# Patient Record
Sex: Female | Born: 1975 | Race: White | Hispanic: No | Marital: Single | Smoking: Never smoker
Health system: Southern US, Community
[De-identification: ages and names within clinical notes are randomized; demographics above are authoritative.]

## PROBLEM LIST (undated history)

## (undated) DIAGNOSIS — Z87440 Personal history of urinary (tract) infections: Secondary | ICD-10-CM

## (undated) HISTORY — DX: Personal history of urinary (tract) infections: Z87.440

## (undated) HISTORY — PX: TONSILLECTOMY: SUR1361

---

## 2010-04-27 LAB — ABO/RH: RH Type: POSITIVE

## 2010-04-27 LAB — GC/CHLAMYDIA PROBE AMP, GENITAL
Chlamydia: NEGATIVE
Gonorrhea: NEGATIVE

## 2010-04-27 LAB — HEPATITIS B SURFACE ANTIGEN: Hepatitis B Surface Ag: NEGATIVE

## 2010-04-27 LAB — RUBELLA ANTIBODY, IGM: Rubella: IMMUNE

## 2010-10-16 LAB — STREP B DNA PROBE: GBS: POSITIVE

## 2010-11-30 ENCOUNTER — Telehealth (HOSPITAL_COMMUNITY): Payer: Self-pay | Admitting: *Deleted

## 2010-11-30 ENCOUNTER — Encounter (HOSPITAL_COMMUNITY): Payer: Self-pay | Admitting: *Deleted

## 2010-11-30 NOTE — Telephone Encounter (Signed)
Preadmission screen  

## 2010-12-01 ENCOUNTER — Inpatient Hospital Stay (HOSPITAL_COMMUNITY)
Admission: AD | Admit: 2010-12-01 | Discharge: 2010-12-06 | DRG: 766 | Disposition: A | Discharge status: Home / Self Care | Payer: Medicaid Other | Source: Ambulatory Visit | Attending: Obstetrics and Gynecology | Admitting: Obstetrics and Gynecology

## 2010-12-01 ENCOUNTER — Encounter (HOSPITAL_COMMUNITY): Payer: Self-pay | Admitting: *Deleted

## 2010-12-01 DIAGNOSIS — O339 Maternal care for disproportion, unspecified: Secondary | ICD-10-CM | POA: Diagnosis present

## 2010-12-01 DIAGNOSIS — O99892 Other specified diseases and conditions complicating childbirth: Secondary | ICD-10-CM | POA: Diagnosis present

## 2010-12-01 DIAGNOSIS — O330 Maternal care for disproportion due to deformity of maternal pelvic bones: Secondary | ICD-10-CM | POA: Diagnosis present

## 2010-12-01 DIAGNOSIS — O324XX0 Maternal care for high head at term, not applicable or unspecified: Secondary | ICD-10-CM | POA: Diagnosis present

## 2010-12-01 DIAGNOSIS — O480 Post-term pregnancy: Principal | ICD-10-CM | POA: Diagnosis present

## 2010-12-01 DIAGNOSIS — Z22330 Carrier of Group B streptococcus: Secondary | ICD-10-CM

## 2010-12-01 LAB — RPR: RPR Ser Ql: NONREACTIVE

## 2010-12-01 LAB — CBC
MCHC: 34 g/dL (ref 30.0–36.0)
Platelets: 212 10*3/uL (ref 150–400)
RDW: 14.5 % (ref 11.5–15.5)

## 2010-12-01 MED ORDER — ZOLPIDEM TARTRATE 10 MG PO TABS
10.0000 mg | ORAL_TABLET | Freq: Every evening | ORAL | Status: DC | PRN
  Administered 2010-12-02: 10 mg via ORAL
  Filled 2010-12-01: qty 2

## 2010-12-01 MED ORDER — LACTATED RINGERS IV SOLN
INTRAVENOUS | Status: DC
  Administered 2010-12-02: 10:00:00 via INTRAVENOUS

## 2010-12-01 MED ORDER — OXYTOCIN 20 UNITS IN LACTATED RINGERS INFUSION - SIMPLE
1.0000 m[IU]/min | INTRAVENOUS | Status: DC
  Administered 2010-12-01: 2 m[IU]/min via INTRAVENOUS
  Filled 2010-12-01: qty 1000

## 2010-12-01 MED ORDER — OXYTOCIN BOLUS FROM INFUSION
500.0000 mL | Freq: Once | INTRAVENOUS | Status: DC
  Filled 2010-12-01: qty 500

## 2010-12-01 MED ORDER — IBUPROFEN 600 MG PO TABS: 600.0000 mg | ORAL_TABLET | Freq: Four times a day (QID) | ORAL | Status: DC | PRN

## 2010-12-01 MED ORDER — LACTATED RINGERS IV SOLN
INTRAVENOUS | Status: DC
  Administered 2010-12-02: 17:00:00 via INTRAVENOUS
  Administered 2010-12-02: 125 mL/h via INTRAVENOUS
  Administered 2010-12-03 (×2): via INTRAVENOUS

## 2010-12-01 MED ORDER — OXYTOCIN 20 UNITS IN LACTATED RINGERS INFUSION - SIMPLE
1.0000 m[IU]/min | INTRAVENOUS | Status: DC
  Administered 2010-12-02: 8 m[IU]/min via INTRAVENOUS
  Administered 2010-12-02: 10 m[IU]/min via INTRAVENOUS

## 2010-12-01 MED ORDER — OXYTOCIN 20 UNITS IN LACTATED RINGERS INFUSION - SIMPLE: 125.0000 mL/h | Freq: Once | INTRAVENOUS | Status: DC

## 2010-12-01 MED ORDER — ONDANSETRON HCL 4 MG/2ML IJ SOLN: 4.0000 mg | Freq: Four times a day (QID) | INTRAMUSCULAR | Status: DC | PRN

## 2010-12-01 MED ORDER — PENICILLIN G POTASSIUM 5000000 UNITS IJ SOLR
2.5000 10*6.[IU] | INTRAVENOUS | Status: DC
  Administered 2010-12-01 – 2010-12-02 (×7): 2.5 10*6.[IU] via INTRAVENOUS
  Filled 2010-12-01 (×11): qty 2.5

## 2010-12-01 MED ORDER — ACETAMINOPHEN 325 MG PO TABS: 650.0000 mg | ORAL_TABLET | ORAL | Status: DC | PRN

## 2010-12-01 MED ORDER — LIDOCAINE HCL (PF) 1 % IJ SOLN
30.0000 mL | INTRAMUSCULAR | Status: DC | PRN
  Filled 2010-12-01 (×2): qty 30

## 2010-12-01 MED ORDER — LACTATED RINGERS IV SOLN
500.0000 mL | INTRAVENOUS | Status: DC | PRN
  Administered 2010-12-02: 300 mL via INTRAVENOUS

## 2010-12-01 MED ORDER — TERBUTALINE SULFATE 1 MG/ML IJ SOLN: 0.2500 mg | Freq: Once | INTRAMUSCULAR | Status: AC | PRN

## 2010-12-01 MED ORDER — FLEET ENEMA 7-19 GM/118ML RE ENEM: 1.0000 | ENEMA | RECTAL | Status: DC | PRN

## 2010-12-01 MED ORDER — CITRIC ACID-SODIUM CITRATE 334-500 MG/5ML PO SOLN
30.0000 mL | ORAL | Status: DC | PRN
  Administered 2010-12-03: 30 mL via ORAL
  Filled 2010-12-01: qty 15

## 2010-12-01 MED ORDER — DEXTROSE 5 % IV SOLN
5.0000 10*6.[IU] | Freq: Once | INTRAVENOUS | Status: AC
  Administered 2010-12-01: 5 10*6.[IU] via INTRAVENOUS
  Filled 2010-12-01: qty 5

## 2010-12-01 MED ORDER — OXYCODONE-ACETAMINOPHEN 5-325 MG PO TABS: 2.0000 | ORAL_TABLET | ORAL | Status: DC | PRN

## 2010-12-01 MED ORDER — BUTORPHANOL TARTRATE 2 MG/ML IJ SOLN: 1.0000 mg | INTRAMUSCULAR | Status: DC | PRN

## 2010-12-01 NOTE — Progress Notes (Signed)
Labor Note:   Patient is comfortable with pitocin at 6 mu.  Feeling ctx but no significant pain.    Reactive and reassuring fetus with baseline in the 120's.    Cervix 1-2 cm/60/-4.    Membranes stripped anteriorly and posteriorly.    Foley bulb placement:  Patient and I discussed the multiple means of induction previously.  I discussed foley bulb with pitocin and stated that in this case that would be an appropriate way to proceed with induction.  Upon placement of the sterile speculum, + blood noted from the cervix and small amount pooling in vault, approx 10 cc.  Foley bulb place per routine and with 20 cc, was removed with gentle traction.  Replaced per routine; grasped with ring forceps and inserted into the lower uterine segment; it felt that foley catheter tracked posteriorly.  Foley inflated with 30 cc and remained in situ with gentle downward traction.  Approx 15 cc blood was released from the foley catheter itself and this then subsided.  + blood noted in vault, approximately 75 cc.  Using ring forceps, no visible cause from ectocervix.  Given blood present upon stripping membranes, possible disruption of one of the cervical vessels.  Vagina packed with betadine soaked kerlex.  After procedure, between contractions, uterus soft and non-tender.  Fetus tolerated the procedure well.  Continue to monitor.  Will check in several hours and consider removing the vaginal pack at that time.

## 2010-12-01 NOTE — Progress Notes (Signed)
Per Clemens [facility] DD, Pt informed we need MAU bed, instructed to stay in hospital.  Pt know she may have regular diet.  Phone numbers for pt & her husband obtained, will call them as soon as bed available.  Pt & husband verbalize understanding.

## 2010-12-01 NOTE — Progress Notes (Signed)
Pt scheduled for induction this a.m., denies any pain, bleeding or LOF.

## 2010-12-01 NOTE — Progress Notes (Signed)
Dr. Paul Half called to check on pt., informed him birthing suites is still full.  MD states he does not want pt to leave, she will need to wait for available room.  Order received for NST q shift while waiting for induction.

## 2010-12-01 NOTE — Progress Notes (Signed)
Pt has been waiting for induction today, back to MAU for 2nd NST while waiting for bed

## 2010-12-01 NOTE — H&P (Signed)
Admission H&P:    Patient is a 35 y.o. G1P0 at [redacted]w[redacted]d who presents for post dates induction of labor.  Patient states she is having occasional contractions which she is tolerating well.  No vb, lof, ctx.  + fm.  Patient had an NST this a.m. Which was reactive.  Patient underwent NST/BPP yesterday which was 10/10.  AFI 21.  EFW 8lb14oz, 83rd %ile.    PMH:  None PSH:  Tonsillectomy POB:  G1P0 PGYN:  No h/o stds including HSV.  + h/o abnormal pap x1 five years ago; others wnl. Allergies:  NKDA Meds:  None, sudafed several days ago. Fam:  Thyroid dz in father.  CAD in all gp's. Soc:  No A/T/D.  Physical Exam  Gravid.  Cephalic on u/s yesterday. SVE:  1/thick/high.  Soft and somewhat mid plane. NST reactive.  Prenatal Labs:  A+/RI/NR/wnl/HIV neg/Hep B neg.  Glucola 126.   GBS +.    A/P:  Patient is a 35 y.o. G1P0 at 31 w 6d.  Presents for post dates induction of labor.  I discussed at length methods of induction inc pit, pit/foley, cytotec.  Will re-examine patient on admission to determine best option.  Discussed pain management options including nothing, iv meds until 8 cm, or epidural and benefits of each.  Also discussed EFW of 8lb14oz.  Discussed risks of increased EFW (83rd %ile) including shoulder dystocia and risk of possible nerve damage, death, fracture of bones during maneuvers, and discussed some of those maneuvers.  Discussed that it would be rec'ed to proceed with c/s > 4500 g but that if adequate pelvis, may proceed with IOL if less than that.  Patient agrees with this and states her sister recently delivered 9+ lb fetus vaginally.  Will proceed with IOL; abx for + GBS.

## 2010-12-02 ENCOUNTER — Inpatient Hospital Stay (HOSPITAL_COMMUNITY): Admission: RE | Admit: 2010-12-02 | Payer: Medicaid Other | Source: Ambulatory Visit

## 2010-12-02 LAB — TYPE AND SCREEN

## 2010-12-02 MED ORDER — PHENYLEPHRINE 40 MCG/ML (10ML) SYRINGE FOR IV PUSH (FOR BLOOD PRESSURE SUPPORT)
80.0000 ug | PREFILLED_SYRINGE | INTRAVENOUS | Status: DC | PRN
  Filled 2010-12-02 (×2): qty 5

## 2010-12-02 MED ORDER — PHENYLEPHRINE 40 MCG/ML (10ML) SYRINGE FOR IV PUSH (FOR BLOOD PRESSURE SUPPORT)
80.0000 ug | PREFILLED_SYRINGE | INTRAVENOUS | Status: DC | PRN
  Filled 2010-12-02: qty 5

## 2010-12-02 MED ORDER — EPHEDRINE 5 MG/ML INJ
10.0000 mg | INTRAVENOUS | Status: DC | PRN
  Filled 2010-12-02 (×2): qty 4

## 2010-12-02 MED ORDER — EPHEDRINE 5 MG/ML INJ
10.0000 mg | INTRAVENOUS | Status: DC | PRN
  Filled 2010-12-02: qty 4

## 2010-12-02 MED ORDER — FENTANYL 2.5 MCG/ML BUPIVACAINE 1/10 % EPIDURAL INFUSION (WH - ANES)
14.0000 mL/h | INTRAMUSCULAR | Status: DC
  Administered 2010-12-02 (×4): 14 mL/h via EPIDURAL
  Filled 2010-12-02 (×5): qty 60

## 2010-12-02 MED ORDER — SODIUM BICARBONATE 8.4 % IV SOLN
INTRAVENOUS | Status: DC | PRN
  Administered 2010-12-02: 5 mL via EPIDURAL

## 2010-12-02 MED ORDER — LACTATED RINGERS IV SOLN: 500.0000 mL | Freq: Once | INTRAVENOUS | Status: DC

## 2010-12-02 MED ORDER — DIPHENHYDRAMINE HCL 50 MG/ML IJ SOLN: 12.5000 mg | INTRAMUSCULAR | Status: DC | PRN

## 2010-12-02 NOTE — Anesthesia Preprocedure Evaluation (Signed)
Anesthesia Evaluation  Name, MR# and DOB Patient awake  General Assessment Comment  Reviewed: Allergy & Precautions, H&P , Patient's Chart, lab work & pertinent test results  Airway Mallampati: II TM Distance: >3 FB Neck ROM: full    Dental  (+) Teeth Intact   Pulmonary  clear to auscultation        Cardiovascular regular Normal    Neuro/Psych    GI/Hepatic   Endo/Other    Renal/GU      Musculoskeletal   Abdominal   Peds  Hematology   Anesthesia Other Findings       Reproductive/Obstetrics (+) Pregnancy                           Anesthesia Physical Anesthesia Plan  ASA: II  Anesthesia Plan: Epidural   Post-op Pain Management:    Induction:   Airway Management Planned:   Additional Equipment:   Intra-op Plan:   Post-operative Plan:   Informed Consent: I have reviewed the patients History and Physical, chart, labs and discussed the procedure including the risks, benefits and alternatives for the proposed anesthesia with the patient or authorized representative who has indicated his/her understanding and acceptance.   Dental Advisory Given  Plan Discussed with: CRNA and Surgeon  Anesthesia Plan Comments: (Labs checked- platelets confirmed with RN in room. Fetal heart tracing, per RN, reportedly stable enough for sitting procedure. Discussed epidural, and patient consents to the procedure:  included risk of possible headache,backache, failed block, allergic reaction, and nerve injury. This patient was asked if she had any questions or concerns before the procedure started. )        Anesthesia Quick Evaluation  

## 2010-12-02 NOTE — Progress Notes (Signed)
Called Dr. Ilda Foil, instructed to reduce pitocin from 7 to 3 and reposition patient to correct variables. Westley Foots, RN

## 2010-12-02 NOTE — Anesthesia Procedure Notes (Signed)
Epidural Patient location during procedure: OB Start time: 12/02/2010 10:11 AM  Preanesthetic Checklist Completed: patient identified, site marked, surgical consent, pre-op evaluation, timeout performed, IV checked, risks and benefits discussed and monitors and equipment checked  Epidural Patient position: sitting Prep: site prepped and draped and DuraPrep Patient monitoring: continuous pulse ox and blood pressure Approach: midline Injection technique: LOR air  Needle:  Needle type: Tuohy  Needle gauge: 17 G Needle length: 9 cm Needle insertion depth: 5 cm cm Catheter type: closed end flexible Catheter size: 19 Gauge Catheter at skin depth: 10 cm Test dose: negative  Assessment Events: blood not aspirated, injection not painful, no injection resistance, negative IV test and no paresthesia  Additional Notes Dosing of Epidural: 1st dose, through needle...... ( mg expressed as equavilent  cc's medication  from .1%Bupiv / fentanyl syringe from L&D pump)...............  5mg  Marcaine  2nd dose, through catheter after waiting 3 minutes.... epi 1:200K + Xylocaine 40 mg 3rd dose, through catheter, after waiting 3 minutes...Marland KitchenMarland Kitchenepi 1:200K + Xylocaine 60 mg ( 2% Xylo charted as a single dose in Epic Meds for ease of charting; actual dosing was fractionated as above, for saftey's sake)  As each dose occurred, patient was free of IV sx; and patient exhibited no evidence of SA injection.  Patient is more comfortable after epidural dosed. Please see RN's note for documentation of vital signs,and FHR which are stable.

## 2010-12-02 NOTE — Progress Notes (Addendum)
April Henderson is a 36 y.o. G1P0 at [redacted]w[redacted]d by ultrasound and dates admitted for induction of labor due to Post dates.  Subjective:  No complaints   Objective: BP 133/69  Pulse 75  Temp(Src) 98.9 F (37.2 C) (Oral)  Resp 18  Ht 5' 6.5" (1.689 m)  Wt 84.369 kg (186 lb)  BMI 29.57 kg/m2  SpO2 97% I/O last 3 completed shifts: In: 887.3 [P.O.:180; I.V.:507.3; IV Piggyback:200] Out: -  Total I/O In: 1664.6 [P.O.:600; I.V.:864.6; IV Piggyback:200] Out: 950 [Urine:950]  FHT:  140s to 150s with good variability and accelerations.  Rare mild deceleration noted. UC:   regular, every 2-3 minutes SVE:   5/ 80/ -3  Labs: Lab Results  Component Value Date   WBC 10.0 12/01/2010   HGB 11.7* 12/01/2010   HCT 34.4* 12/01/2010   MCV 90.8 12/01/2010   PLT 212 12/01/2010    Assessment / Plan: Induction of labor.  Progressing slowly on pitocin.  No descent yet.  Large baby.  IUPC placed and forebag ruptured with bloody fluid released.  Explained to patient and spouse of possibility of CS if progress does not improve or if worsening FHR tracing or excess bleeding or other fetal or maternal indication if develops.  Patient expressed a desire to delay CS as long as possible since she desires natural delivery.   Labor: Slow progress.  cervix dilated a little more but no descent yet, still -3.Marland Kitchen Fetal Wellbeing:  Category I Pain Control:  Epidural Anticipated MOD:  Undetermined at this time.  GREENE,ELEANOR E 12/02/2010, 5:23 PM   This assessment was based on my exam around1:30pm today.

## 2010-12-02 NOTE — Progress Notes (Signed)
Dr. Neva Seat gave orders for pt to labor down

## 2010-12-02 NOTE — Progress Notes (Signed)
Progress Note:    Foley bulb in situ and taped to patient's inner thigh on gentle traction.  Patient comfortable and requested ambien for sleep aid.  Fetus has sleep cycles and periods of appropriate reactivity.  + variability.  Will allow vaginal packing to remain in situ.  Will re-evaluate in the a.m.  Continue pitocin.  May decrease if tachysystole.  Continue PCN for + GBS.  IV meds prn and epidural prn.

## 2010-12-02 NOTE — Progress Notes (Signed)
Labor Note:    Pt comfortable.  Some contractions are increasingly strong, but is tolerating them well otherwise.  No other complaints.  Was able to void without difficulty x2.    AFVSS.  One BP during exam of 140/80; recheck 130's/70's. Toco:  q 1-2 min.   120's+ accels.  No decels currently.  + variability.  Formerly variable decels noted. Cervix:  4/40/-3.   SROM noted with copious clear fluid.  Head well applied to cervix.   Minimal normal bloody show noted on vaginal exam only several minutes after cervical exam revealed above findings. Pitocin at 3 mu.  A/P:  Continue induction for postdates.  Reassuring maternofetal status.  Continue pitocin.  Now that ctx have spaced out with removal of foley bulb, can increase pitocin as tolerated.  Continue PCN for GBS + status.  IV meds and epidural prn.

## 2010-12-03 ENCOUNTER — Encounter (HOSPITAL_COMMUNITY)
Admission: AD | Disposition: A | Discharge status: Home / Self Care | Payer: Self-pay | Source: Ambulatory Visit | Attending: Obstetrics and Gynecology

## 2010-12-03 ENCOUNTER — Encounter (HOSPITAL_COMMUNITY): Payer: Self-pay | Admitting: Anesthesiology

## 2010-12-03 ENCOUNTER — Inpatient Hospital Stay (HOSPITAL_COMMUNITY): Payer: Medicaid Other | Admitting: Anesthesiology

## 2010-12-03 ENCOUNTER — Encounter (HOSPITAL_COMMUNITY): Payer: Self-pay | Admitting: *Deleted

## 2010-12-03 SURGERY — Surgical Case
Anesthesia: Epidural | Site: Abdomen | Wound class: Clean Contaminated

## 2010-12-03 MED ORDER — ONDANSETRON HCL 4 MG PO TABS: 4.0000 mg | ORAL_TABLET | ORAL | Status: DC | PRN

## 2010-12-03 MED ORDER — MEPERIDINE HCL 25 MG/ML IJ SOLN
INTRAMUSCULAR | Status: DC | PRN
  Administered 2010-12-03: 25 mg via INTRAVENOUS

## 2010-12-03 MED ORDER — OXYTOCIN 20 UNITS IN LACTATED RINGERS INFUSION - SIMPLE: 125.0000 mL/h | INTRAVENOUS | Status: AC

## 2010-12-03 MED ORDER — SIMETHICONE 80 MG PO CHEW
80.0000 mg | CHEWABLE_TABLET | ORAL | Status: DC | PRN
  Administered 2010-12-04 – 2010-12-05 (×2): 80 mg via ORAL

## 2010-12-03 MED ORDER — ONDANSETRON HCL 4 MG/2ML IJ SOLN: 4.0000 mg | Freq: Three times a day (TID) | INTRAMUSCULAR | Status: DC | PRN

## 2010-12-03 MED ORDER — MORPHINE SULFATE (PF) 0.5 MG/ML IJ SOLN
INTRAMUSCULAR | Status: DC | PRN
  Administered 2010-12-03: 4 mg via EPIDURAL
  Administered 2010-12-03: 1 mg via INTRAVENOUS

## 2010-12-03 MED ORDER — LIDOCAINE-EPINEPHRINE (PF) 2 %-1:200000 IJ SOLN
INTRAMUSCULAR | Status: AC
  Filled 2010-12-03: qty 20

## 2010-12-03 MED ORDER — OXYCODONE-ACETAMINOPHEN 5-325 MG PO TABS
1.0000 | ORAL_TABLET | ORAL | Status: DC | PRN
  Administered 2010-12-03 – 2010-12-06 (×9): 1 via ORAL
  Administered 2010-12-06: 2 via ORAL
  Filled 2010-12-03 (×5): qty 1
  Filled 2010-12-03: qty 2
  Filled 2010-12-03 (×4): qty 1

## 2010-12-03 MED ORDER — DIBUCAINE 1 % RE OINT: 1.0000 "application " | TOPICAL_OINTMENT | RECTAL | Status: DC | PRN

## 2010-12-03 MED ORDER — SODIUM CHLORIDE 0.9 % IJ SOLN: 3.0000 mL | INTRAMUSCULAR | Status: DC | PRN

## 2010-12-03 MED ORDER — HYDROMORPHONE HCL 1 MG/ML IJ SOLN
0.2500 mg | INTRAMUSCULAR | Status: DC | PRN
  Administered 2010-12-03 (×2): 0.5 mg via INTRAVENOUS

## 2010-12-03 MED ORDER — KETOROLAC TROMETHAMINE 60 MG/2ML IM SOLN
60.0000 mg | Freq: Once | INTRAMUSCULAR | Status: AC | PRN
  Administered 2010-12-03: 60 mg via INTRAMUSCULAR

## 2010-12-03 MED ORDER — DIPHENHYDRAMINE HCL 25 MG PO CAPS: 25.0000 mg | ORAL_CAPSULE | Freq: Four times a day (QID) | ORAL | Status: DC | PRN

## 2010-12-03 MED ORDER — SENNOSIDES-DOCUSATE SODIUM 8.6-50 MG PO TABS
2.0000 | ORAL_TABLET | Freq: Every day | ORAL | Status: DC
  Administered 2010-12-03 – 2010-12-05 (×3): 2 via ORAL

## 2010-12-03 MED ORDER — NALBUPHINE HCL 10 MG/ML IJ SOLN
5.0000 mg | INTRAMUSCULAR | Status: DC | PRN
  Filled 2010-12-03: qty 1

## 2010-12-03 MED ORDER — SCOPOLAMINE 1 MG/3DAYS TD PT72
MEDICATED_PATCH | TRANSDERMAL | Status: AC
  Filled 2010-12-03: qty 1

## 2010-12-03 MED ORDER — MORPHINE SULFATE 10 MG/ML IJ SOLN: 8.0000 mg | INTRAMUSCULAR | Status: DC | PRN

## 2010-12-03 MED ORDER — TETANUS-DIPHTH-ACELL PERTUSSIS 5-2.5-18.5 LF-MCG/0.5 IM SUSP: 0.5000 mL | Freq: Once | INTRAMUSCULAR | Status: DC

## 2010-12-03 MED ORDER — MEPERIDINE HCL 25 MG/ML IJ SOLN: 6.2500 mg | INTRAMUSCULAR | Status: DC | PRN

## 2010-12-03 MED ORDER — MORPHINE SULFATE 0.5 MG/ML IJ SOLN
INTRAMUSCULAR | Status: AC
  Filled 2010-12-03: qty 10

## 2010-12-03 MED ORDER — CEFAZOLIN SODIUM 1-5 GM-% IV SOLN
INTRAVENOUS | Status: AC
  Filled 2010-12-03: qty 50

## 2010-12-03 MED ORDER — ZOLPIDEM TARTRATE 5 MG PO TABS: 5.0000 mg | ORAL_TABLET | Freq: Every evening | ORAL | Status: DC | PRN

## 2010-12-03 MED ORDER — MEDROXYPROGESTERONE ACETATE 150 MG/ML IM SUSP: 150.0000 mg | INTRAMUSCULAR | Status: DC | PRN

## 2010-12-03 MED ORDER — OXYTOCIN 10 UNIT/ML IJ SOLN
INTRAMUSCULAR | Status: AC
  Filled 2010-12-03: qty 4

## 2010-12-03 MED ORDER — ONDANSETRON HCL 4 MG/2ML IJ SOLN
INTRAMUSCULAR | Status: DC | PRN
  Administered 2010-12-03: 4 mg via INTRAVENOUS

## 2010-12-03 MED ORDER — KETOROLAC TROMETHAMINE 60 MG/2ML IM SOLN
INTRAMUSCULAR | Status: AC
  Filled 2010-12-03: qty 2

## 2010-12-03 MED ORDER — SIMETHICONE 80 MG PO CHEW
80.0000 mg | CHEWABLE_TABLET | Freq: Three times a day (TID) | ORAL | Status: DC
  Administered 2010-12-03 – 2010-12-06 (×13): 80 mg via ORAL

## 2010-12-03 MED ORDER — SCOPOLAMINE 1 MG/3DAYS TD PT72
1.0000 | MEDICATED_PATCH | Freq: Once | TRANSDERMAL | Status: AC
  Administered 2010-12-03: 1.5 mg via TRANSDERMAL

## 2010-12-03 MED ORDER — SODIUM BICARBONATE 8.4 % IV SOLN
INTRAVENOUS | Status: AC
  Filled 2010-12-03: qty 50

## 2010-12-03 MED ORDER — ONDANSETRON HCL 4 MG/2ML IJ SOLN: 4.0000 mg | Freq: Once | INTRAMUSCULAR | Status: DC | PRN

## 2010-12-03 MED ORDER — ONDANSETRON HCL 4 MG/2ML IJ SOLN
INTRAMUSCULAR | Status: AC
  Filled 2010-12-03: qty 2

## 2010-12-03 MED ORDER — LACTATED RINGERS IV SOLN
INTRAVENOUS | Status: DC
  Administered 2010-12-03 (×2): via INTRAVENOUS

## 2010-12-03 MED ORDER — DIPHENHYDRAMINE HCL 25 MG PO CAPS: 25.0000 mg | ORAL_CAPSULE | ORAL | Status: DC | PRN

## 2010-12-03 MED ORDER — DIPHENHYDRAMINE HCL 50 MG/ML IJ SOLN: 12.5000 mg | INTRAMUSCULAR | Status: DC | PRN

## 2010-12-03 MED ORDER — IBUPROFEN 600 MG PO TABS
600.0000 mg | ORAL_TABLET | Freq: Four times a day (QID) | ORAL | Status: DC | PRN
  Filled 2010-12-03 (×8): qty 1

## 2010-12-03 MED ORDER — MORPHINE SULFATE 4 MG/ML IJ SOLN: 8.0000 mg | INTRAMUSCULAR | Status: DC | PRN

## 2010-12-03 MED ORDER — LANOLIN HYDROUS EX OINT: 1.0000 "application " | TOPICAL_OINTMENT | CUTANEOUS | Status: DC | PRN

## 2010-12-03 MED ORDER — OXYTOCIN 20 UNITS IN LACTATED RINGERS INFUSION - SIMPLE
INTRAVENOUS | Status: DC | PRN
  Administered 2010-12-03: 20 [IU] via INTRAVENOUS

## 2010-12-03 MED ORDER — WITCH HAZEL-GLYCERIN EX PADS: 1.0000 "application " | MEDICATED_PAD | CUTANEOUS | Status: DC | PRN

## 2010-12-03 MED ORDER — ONDANSETRON HCL 4 MG/2ML IJ SOLN: 4.0000 mg | INTRAMUSCULAR | Status: DC | PRN

## 2010-12-03 MED ORDER — DIPHENHYDRAMINE HCL 50 MG/ML IJ SOLN: 25.0000 mg | INTRAMUSCULAR | Status: DC | PRN

## 2010-12-03 MED ORDER — SODIUM CHLORIDE 0.9 % IV SOLN
1.0000 ug/kg/h | INTRAVENOUS | Status: DC | PRN
  Filled 2010-12-03: qty 2.5

## 2010-12-03 MED ORDER — CEFAZOLIN SODIUM 1-5 GM-% IV SOLN
INTRAVENOUS | Status: DC | PRN
  Administered 2010-12-03: 1 g via INTRAVENOUS

## 2010-12-03 MED ORDER — METOCLOPRAMIDE HCL 5 MG/ML IJ SOLN: 10.0000 mg | Freq: Three times a day (TID) | INTRAMUSCULAR | Status: DC | PRN

## 2010-12-03 MED ORDER — MEASLES, MUMPS & RUBELLA VAC ~~LOC~~ INJ
0.5000 mL | INJECTION | Freq: Once | SUBCUTANEOUS | Status: DC
  Filled 2010-12-03: qty 0.5

## 2010-12-03 MED ORDER — NALOXONE HCL 0.4 MG/ML IJ SOLN: 0.4000 mg | INTRAMUSCULAR | Status: DC | PRN

## 2010-12-03 MED ORDER — IBUPROFEN 600 MG PO TABS
600.0000 mg | ORAL_TABLET | Freq: Four times a day (QID) | ORAL | Status: DC
  Administered 2010-12-03 – 2010-12-06 (×13): 600 mg via ORAL
  Filled 2010-12-03 (×5): qty 1

## 2010-12-03 MED ORDER — KETOROLAC TROMETHAMINE 30 MG/ML IJ SOLN: 30.0000 mg | Freq: Four times a day (QID) | INTRAMUSCULAR | Status: AC | PRN

## 2010-12-03 MED ORDER — PRENATAL PLUS 27-1 MG PO TABS
1.0000 | ORAL_TABLET | Freq: Every day | ORAL | Status: DC
  Administered 2010-12-03 – 2010-12-06 (×4): 1 via ORAL
  Filled 2010-12-03 (×4): qty 1

## 2010-12-03 MED ORDER — HYDROMORPHONE HCL 1 MG/ML IJ SOLN
INTRAMUSCULAR | Status: AC
  Filled 2010-12-03: qty 1

## 2010-12-03 MED ORDER — KETOROLAC TROMETHAMINE 30 MG/ML IJ SOLN: 15.0000 mg | Freq: Once | INTRAMUSCULAR | Status: DC | PRN

## 2010-12-03 MED ORDER — MEPERIDINE HCL 25 MG/ML IJ SOLN
INTRAMUSCULAR | Status: AC
  Filled 2010-12-03: qty 1

## 2010-12-03 MED ORDER — MENTHOL 3 MG MT LOZG: 1.0000 | LOZENGE | OROMUCOSAL | Status: DC | PRN

## 2010-12-03 SURGICAL SUPPLY — 32 items
CLOTH BEACON ORANGE TIMEOUT ST (SAFETY) ×2 IMPLANT
DERMABOND ADVANCED (GAUZE/BANDAGES/DRESSINGS)
DERMABOND ADVANCED .7 DNX12 (GAUZE/BANDAGES/DRESSINGS) IMPLANT
DRESSING TELFA 8X3 (GAUZE/BANDAGES/DRESSINGS) IMPLANT
DRSG COVADERM 4X8 (GAUZE/BANDAGES/DRESSINGS) ×2 IMPLANT
DURAPREP 26ML APPLICATOR (WOUND CARE) ×2 IMPLANT
ELECT REM PT RETURN 9FT ADLT (ELECTROSURGICAL) ×2
ELECTRODE REM PT RTRN 9FT ADLT (ELECTROSURGICAL) ×1 IMPLANT
EXTRACTOR VACUUM M CUP 4 TUBE (SUCTIONS) IMPLANT
GAUZE SPONGE 4X4 12PLY STRL LF (GAUZE/BANDAGES/DRESSINGS) IMPLANT
GLOVE BIO SURGEON STRL SZ 6.5 (GLOVE) ×2 IMPLANT
GLOVE BIOGEL PI IND STRL 7.0 (GLOVE) ×2 IMPLANT
GLOVE BIOGEL PI INDICATOR 7.0 (GLOVE) ×2
GOWN PREVENTION PLUS LG XLONG (DISPOSABLE) ×2 IMPLANT
GOWN PREVENTION PLUS XLARGE (GOWN DISPOSABLE) ×2 IMPLANT
KIT ABG SYR 3ML LUER SLIP (SYRINGE) IMPLANT
NEEDLE HYPO 25X5/8 SAFETYGLIDE (NEEDLE) IMPLANT
NS IRRIG 1000ML POUR BTL (IV SOLUTION) ×2 IMPLANT
PACK C SECTION WH (CUSTOM PROCEDURE TRAY) ×2 IMPLANT
PAD ABD 7.5X8 STRL (GAUZE/BANDAGES/DRESSINGS) IMPLANT
RTRCTR C-SECT PINK 25CM LRG (MISCELLANEOUS) IMPLANT
SLEEVE SCD COMPRESS KNEE MED (MISCELLANEOUS) IMPLANT
SUT CHROMIC 2 0 CT 1 (SUTURE) ×4 IMPLANT
SUT PLAIN 2 0 (SUTURE)
SUT PLAIN 2 0 XLH (SUTURE) ×2 IMPLANT
SUT PLAIN ABS 2-0 54XMFL TIE (SUTURE) IMPLANT
SUT VIC AB 0 CT1 36 (SUTURE) ×8 IMPLANT
SUT VIC AB 3-0 SH 18 (SUTURE) ×2 IMPLANT
SUT VIC AB 4-0 KS 27 (SUTURE) ×2 IMPLANT
TOWEL OR 17X24 6PK STRL BLUE (TOWEL DISPOSABLE) ×4 IMPLANT
TRAY FOLEY CATH 14FR (SET/KITS/TRAYS/PACK) IMPLANT
WATER STERILE IRR 1000ML POUR (IV SOLUTION) ×2 IMPLANT

## 2010-12-03 NOTE — Op Note (Signed)
April Section Procedure Note   April Henderson   12/03/2010  Indications: Failure to descend, failure to progress   Pre-operative Diagnosis: Intrauterine Pregnancy At 42 weeks; Laboring; Cephlopelvic Disproportion; Failure To Descend.   Post-operative Diagnosis: Same   Surgeon: Fortino Sic  Assistants: none  Anesthesia: epidural  Procedure Details:  The patient was seen in the Holding Room. The risks, benefits, complications, treatment options, and expected outcomes were discussed with the patient. The patient concurred with the proposed plan, giving informed consent. The patient was identified as April Henderson and the procedure verified as C-Section Delivery. A Time Out was held and the above information confirmed.  After induction of anesthesia, the patient was draped and prepped in the usual sterile manner. A transverse incision was made and carried down through the subcutaneous tissue to the fascia. The fascial incision was made and extended transversely. The fascia was separated from the underlying rectus tissue superiorly and inferiorly. The peritoneum was identified and entered. The peritoneal incision was extended longitudinally. The utero-vesical peritoneal reflection was incised transversely and the bladder flap was bluntly freed from the lower uterine segment. A low transverse uterine incision was made. Delivered from cephalic presentation was a 7 lb, 12 oz living newborn female infant with Apgar scores of 8 at one minute and 9 at five minutes. A cord ph was not sent. The umbilical cord was clamped and cut cord. A sample was obtained for evaluation. The placenta was removed Intact and appeared normal.  The uterine incision was closed with running locked sutures of 1-0 vicryl. A second imbricating layer of the same suture was placed.  Hemostasis was observed. The paracolic gutters were irrigated. The fascia was then reapproximated with running sutures of 1-0Vicryl. The  subcuticular closure was performed using 2-0  Plain.  The skin was closed with a Mellody Dance needle using 4 0 vicryl in a subcuticular fashion.   Instrument, sponge, and needle counts were correct prior the abdominal closure and were correct at the conclusion of the case.    Findings: viable female , Apgars 8, 9,  Weight 7lb 12 oz   Estimated Blood Loss:  750 cc  Total IV Fluids and Urine Output:  See anesthesia record  Specimens: Placenta Specimens    None       Complications: no complications  Disposition: PACU - hemodynamically stable.  Maternal Condition: stable   Baby condition / location:  nursery-stable    Signed: Surgeon(s): Fortino Sic, MD

## 2010-12-03 NOTE — Progress Notes (Signed)
Pt advised and educated about c/s by Dr. Neva Seat.  Pt agreed and consent signed

## 2010-12-03 NOTE — Progress Notes (Signed)
Encounter addended by: Suella Grove on: 12/03/2010  9:49 AM<BR>     Documentation filed: Notes Section, Charges VN

## 2010-12-03 NOTE — Anesthesia Postprocedure Evaluation (Signed)
Anesthesia Post Note  Patient: April Henderson  Procedure(s) Performed:  CESAREAN SECTION  Anesthesia type: Epidural  Patient location: PACU  Post pain: Pain level controlled  Post assessment: Post-op Vital signs reviewed  Last Vitals:  Filed Vitals:   12/03/10 0330  BP: 125/72  Pulse: 81  Temp: 98 F (36.7 C)  Resp: 16    Post vital signs: Reviewed  Level of consciousness: awake  Complications: No apparent anesthesia complications

## 2010-12-03 NOTE — Progress Notes (Signed)
April Henderson is a 35 y.o. G1P0 at [redacted]w[redacted]d by ultrasound admitted for induction of labor due to Post dates.   Subjective:  No complaints   Objective: BP 150/83  Pulse 76  Temp(Src) 99.6 F (37.6 C) (Oral)  Resp 20  Ht 5' 6.5" (1.689 m)  Wt 84.369 kg (186 lb)  BMI 29.57 kg/m2  SpO2 97% I/O last 3 completed shifts: In: 3425.2 [P.O.:1020; I.V.:2005.2; IV Piggyback:400] Out: 2450 [Urine:2450] Total I/O In: 867.1 [P.O.:240; I.V.:627.1] Out: 200 [Urine:200]  FHT:  Within normal limits.  Occasional varible.  No late decels noted recently. UC:   regular, every 2-3 minutes SVE:   Dilation: 10 Effacement (%): 100 Station: 0 Exam by:: a. white rn and confirmed by Dr Neva Seat.  No descent, caput and molding present.  Labs: Lab Results  Component Value Date   WBC 10.0 12/01/2010   HGB 11.7* 12/01/2010   HCT 34.4* 12/01/2010   MCV 90.8 12/01/2010   PLT 212 12/01/2010    Assessment / Plan: Arrest of decent  Labor: not progressing despite adequate pitocin Fetal Wellbeing:  Category I Pain Control:  Epidural Anticipated MOD:  Cesarean Section.  Status discussed with patient and spouse.  CS recommended for failure to descend and failure to progress. Despite adequate labor the cervix has failed to progress over three hours.  Caput and molding are present.   EFW is over 4000 gms. The risk of shoulder dystocia was discussed.    The patient gave informed consent for Cesarean section after risks possible complications of CS and continued attempts at vaginal delivery had been discussed.  April Henderson 12/03/2010, 1:46 AM

## 2010-12-03 NOTE — Anesthesia Postprocedure Evaluation (Signed)
  Anesthesia Post-op Note  Patient: April Henderson  Procedure(s) Performed:  CESAREAN SECTION  Patient Location: Mother/Baby  Anesthesia Type: Spinal  Level of Consciousness: awake and alert   Airway and Oxygen Therapy: Patient Spontanous Breathing  Post-op Pain: none  Post-op Assessment: Post-op Vital signs reviewed  Post-op Vital Signs: Reviewed and stable  Complications: No apparent anesthesia complications2

## 2010-12-03 NOTE — Progress Notes (Signed)
UR Chart review completed.  

## 2010-12-03 NOTE — Preoperative (Signed)
Beta Blockers   Reason not to administer Beta Blockers:Not Applicable 

## 2010-12-03 NOTE — Transfer of Care (Signed)
Immediate Anesthesia Transfer of Care Note  Patient: April Henderson  Procedure(s) Performed:  CESAREAN SECTION  Patient Location: PACU  Anesthesia Type: Epidural  Level of Consciousness: awake, alert , oriented and patient cooperative  Airway & Oxygen Therapy: Patient Spontanous Breathing  Post-op Assessment: Report given to PACU RN and Post -op Vital signs reviewed and stable  Post vital signs: Reviewed and stable  Complications: No apparent anesthesia complications

## 2010-12-04 LAB — CBC
Hemoglobin: 7.9 g/dL — ABNORMAL LOW (ref 12.0–15.0)
Platelets: 144 10*3/uL — ABNORMAL LOW (ref 150–400)
RBC: 2.6 MIL/uL — ABNORMAL LOW (ref 3.87–5.11)
WBC: 15.4 10*3/uL — ABNORMAL HIGH (ref 4.0–10.5)

## 2010-12-04 NOTE — Progress Notes (Signed)
Post Partum Day 1 s/p LTCS Subjective: no complaints, up ad lib, voiding and tolerating PO  Objective: Blood pressure 121/81, pulse 84, temperature 97.8 F (36.6 C), temperature source Oral, resp. rate 18, height 5' 6.5" (1.689 m), weight 84.369 kg (186 lb), SpO2 99.00%, unknown if currently breastfeeding.  Physical Exam:  General: alert and cooperative Lochia: appropriate Uterine Fundus: firm Incision: bandage is clean dry and intact  DVT Evaluation: No evidence of DVT seen on physical exam.   Basename 12/04/10 0515  HGB 7.9*  HCT 24.1*    Assessment/Plan: Discharge home in 1-2 days Routine postpartum care..  Pt may shower    LOS: 3 days   April Henderson J. 12/04/2010, 11:17 AM

## 2010-12-05 ENCOUNTER — Encounter (HOSPITAL_COMMUNITY): Payer: Self-pay | Admitting: Obstetrics and Gynecology

## 2010-12-05 NOTE — Progress Notes (Signed)
Post Partum Day 2 Subjective: no complaints, up ad lib, voiding and tolerating PO  Objective: Blood pressure 128/81, pulse 78, temperature 98.2 F (36.8 C), temperature source Oral, resp. rate 18, height 5' 6.5" (1.689 m), weight 84.369 kg (186 lb), SpO2 98.00%, unknown if currently breastfeeding.  Physical Exam:  General: alert and cooperative Lochia: appropriate Uterine Fundus: firm Incision: healing well DVT Evaluation: No evidence of DVT seen on physical exam.   Basename 12/04/10 0515  HGB 7.9*  HCT 24.1*    Assessment/Plan: Plan for discharge tomorrow   LOS: 4 days   Takeem Krotzer J. 12/05/2010, 9:25 AM

## 2010-12-06 NOTE — Progress Notes (Signed)
POD#3  Patient without complaints.  Bleeding appropriate.  Pain controlled.  + flatus since c/s.  Tolerating diet and up and ambulating without difficulty.  AFVSS Incision:  C/d/i with steris and stitch. Abd:  Mild distention.  Uterine fundus nontender at umbilicus. Lower Ext:  + pitting edema bilaterally; symmetric.  No c/c.  Homan's neg.  Crit 24.1.  A/P:  Discussed contraceptive options.  D/C home today.  Wound check in one week and f/u for pp visit in 6 weeks.  D/C with FeSO4, Colace, Motrin, Percocet (#30).  Breastfeeding without difficulty.  Routine instructions given verbally to patient.  Will also call in per request by lactation consultant a nipple cream for patient given bleeding from nipples.  Discussed VBAC and less than 15% success rate other factors being equal given complete dilation and failure to progress.  Recommended future c/s.

## 2010-12-14 NOTE — Discharge Summary (Signed)
D/C summary dictated # [id number]

## 2010-12-15 NOTE — Discharge Summary (Signed)
April Henderson, April Henderson NO.:  [id number]  MEDICAL RECORD NO.:  [id number]  LOCATION:  9148                          FACILITY:  [facility]  PHYSICIAN:  Pricilla Holm, MD      DATE OF BIRTH:  1976/02/05  DATE OF ADMISSION:  12/01/2010 DATE OF DISCHARGE:  12/06/2010                              DISCHARGE SUMMARY   PRINCIPAL DIAGNOSIS:  Pregnancy at 41-weeks 6 days, presents for induction of labor.  HISTORY OF PRESENT ILLNESS:  Please see dictated H and P for further details.  HOSPITAL COURSE:  The patient was admitted on December 01, 2010, for postdates pregnancy and induction thereof.  The patient was admitted and labs were checked.  The patient's cervix was evaluated and felt that she was a good candidate for a Foley bulb induction.  Therefore, Foley bulb placements were performed.  The patient did have heavier bleeding than would be expected with Foley bulb placement and this was thought to be secondary to disruption of a lower uterine segment vessel, which occurred during stripping of the patient's membranes which occurred immediately prior to the patient's Foley bulb placement.  Please see written notes for further details regarding this.  The patient was also GBS positive and therefore was started on penicillin.  The patient slowly progressed through labor and was noted to have become complete. The patient did have some decelerations noted during labor and therefore, Pitocin was decreased.  Spontaneously rupture of membranes was noted upon gentle traction with a Foley bulb which was noted to have been released into the vagina.  The patient received an epidural during labor.  An IUPC had been placed and despite dilation to 10 cm, the patient failed to evidence any dehiscence of the fetal head.  Given the presence of caput and moulding and estimated fetal weight over 4000 g, the possibility of a C-section was discussed with the patient.  Please see written notes  regarding this.  Given the fact that the patient had no significant dehiscence after a time, the patient and Dr. Chilton Si discussed cesarean delivery.  The patient opted for C-section at that time.  Please see procedure note for further details.  The patient delivered on December 03, 2010, by cesarean delivery for failure to descend and failure to progress.  Additional diagnosis was cephalopelvic disproportion.  The procedure was uncomplicated and resultant delivery of a viable infant female of Apgars of 8 and 9, weighing 7 pounds 12 ounces.  With respect to the patient's postoperative course, the patient remained afebrile with stable vital signs during her recovery.  The patient was discharged on postoperative day 3.  By the time, she was without complaints.  Her pain was controlled.  She was voiding and tolerating p.o.  She had positive flatus noted.  Her exam was consistent with a person who was recently postop and there was no evidence of DVT.  The incision on the day of discharge was noted to be clean, dry, and intact with Steri-strips.  The patient was noted to have a postoperative crit of 24.  Therefore, the patient was started on twice daily iron in the hospital.  The patient was discharged on postop day 3 in  stable condition.  Contraceptive options were discussed.  The patient opted for followup with respect to this in 6 weeks.  The patient was discharged with iron sulfate, Colace, Motrin, and Percocet #30.  The patient was breastfeeding.  Routine verbal instructions were given.  I discussed the possibility of future VBAC and stated that she probably has a rather low, possibly even as low as 15% chance or less of a successful VBAC, given her arrest of dilatation of 10 cm and cephalopelvic disproportion.  The patient's blood type is A positive and therefore, no RhoGAM was required.  The patient was discharged in stable condition.          ______________________________ Pricilla Holm, MD     RB/MEDQ  D:  12/14/2010  T:  12/15/2010  Job:  [id number]

## 2012-01-17 ENCOUNTER — Other Ambulatory Visit (HOSPITAL_COMMUNITY)
Admission: RE | Admit: 2012-01-17 | Discharge: 2012-01-17 | Disposition: A | Discharge status: Home / Self Care | Payer: Medicaid Other | Source: Ambulatory Visit | Attending: Obstetrics & Gynecology | Admitting: Obstetrics & Gynecology

## 2012-01-17 DIAGNOSIS — Z124 Encounter for screening for malignant neoplasm of cervix: Secondary | ICD-10-CM | POA: Insufficient documentation

## 2012-01-17 DIAGNOSIS — Z1151 Encounter for screening for human papillomavirus (HPV): Secondary | ICD-10-CM | POA: Insufficient documentation

## 2013-12-23 ENCOUNTER — Encounter (HOSPITAL_COMMUNITY): Payer: Self-pay | Admitting: Obstetrics and Gynecology

## 2014-01-05 ENCOUNTER — Emergency Department (HOSPITAL_BASED_OUTPATIENT_CLINIC_OR_DEPARTMENT_OTHER): Payer: Medicaid Other

## 2014-01-05 ENCOUNTER — Encounter (HOSPITAL_BASED_OUTPATIENT_CLINIC_OR_DEPARTMENT_OTHER): Payer: Self-pay | Admitting: *Deleted

## 2014-01-05 ENCOUNTER — Emergency Department (HOSPITAL_BASED_OUTPATIENT_CLINIC_OR_DEPARTMENT_OTHER)
Admission: EM | Admit: 2014-01-05 | Discharge: 2014-01-05 | Disposition: A | Discharge status: Home / Self Care | Payer: Medicaid Other | Attending: Emergency Medicine | Admitting: Emergency Medicine

## 2014-01-05 DIAGNOSIS — Z7951 Long term (current) use of inhaled steroids: Secondary | ICD-10-CM | POA: Insufficient documentation

## 2014-01-05 DIAGNOSIS — S61216A Laceration without foreign body of right little finger without damage to nail, initial encounter: Secondary | ICD-10-CM | POA: Diagnosis not present

## 2014-01-05 DIAGNOSIS — W231XXA Caught, crushed, jammed, or pinched between stationary objects, initial encounter: Secondary | ICD-10-CM | POA: Diagnosis not present

## 2014-01-05 DIAGNOSIS — Z9889 Other specified postprocedural states: Secondary | ICD-10-CM | POA: Insufficient documentation

## 2014-01-05 DIAGNOSIS — Z87440 Personal history of urinary (tract) infections: Secondary | ICD-10-CM | POA: Insufficient documentation

## 2014-01-05 DIAGNOSIS — Y9389 Activity, other specified: Secondary | ICD-10-CM | POA: Insufficient documentation

## 2014-01-05 DIAGNOSIS — S6991XA Unspecified injury of right wrist, hand and finger(s), initial encounter: Secondary | ICD-10-CM | POA: Diagnosis present

## 2014-01-05 DIAGNOSIS — Y9289 Other specified places as the place of occurrence of the external cause: Secondary | ICD-10-CM | POA: Insufficient documentation

## 2014-01-05 DIAGNOSIS — Z79899 Other long term (current) drug therapy: Secondary | ICD-10-CM | POA: Diagnosis not present

## 2014-01-05 DIAGNOSIS — S61219A Laceration without foreign body of unspecified finger without damage to nail, initial encounter: Secondary | ICD-10-CM

## 2014-01-05 DIAGNOSIS — Y998 Other external cause status: Secondary | ICD-10-CM | POA: Diagnosis not present

## 2014-01-05 DIAGNOSIS — W19XXXA Unspecified fall, initial encounter: Secondary | ICD-10-CM

## 2014-01-05 NOTE — ED Notes (Signed)
States she injured pinky finger at [employer] day. Pt right  Pinky finger in curled under in normal position for pt d/t previous injury since Nov 2009. Now finger knuckle is red and swollen and has tear on palmside of pinky finger.

## 2014-01-05 NOTE — ED Notes (Signed)
I cleaned wound, applied bacitracin and wrapped finger with kerlix and 2x2 as Dr. Fredderick PhenixBelfi advised, wound care completed. Patient ready for discharge.

## 2014-01-05 NOTE — ED Provider Notes (Signed)
CSN: [id number]     Arrival date & time 01/05/14  1711 History  This chart was scribed for Rolan BuccoMelanie Shawnee Higham, MD by Roxy Cedarhandni Bhalodia, ED Scribe. This patient was seen in room MH11/MH11 and the patient's care was started at 6:29 PM.   Chief Complaint  Patient presents with  . Finger Injury   The history is provided by the patient. No language interpreter was used.    HPI Comments: April Henderson is a 38 y.o. female who presents to the Emergency Department complaining of injury to right pinky finger. She states that patient initially slammed her pinky in a door and had surgery done to it several years ago in [place], [place]. Since that time, it has been contracted.  She did not follow back up with the hand surgeon due to lack on insurance.  Patient is here today due to injury while playing at a jump and fun today. Pinky finer is curled in under in normal position for patient. It got caught on something in the bouncy house and pulled it. Patient's finger knuckle is red and swollen and has a tear on the palm-side of her pinky finger.   Past Medical History  Diagnosis Date  . History of recurrent UTIs    Past Surgical History  Procedure Laterality Date  . Tonsillectomy    . Cesarean section  12/03/2010    Procedure: CESAREAN SECTION;  Surgeon: Fortino SicEleanor E Greene, MD;  Location: [facility];  Service: Gynecology;  Laterality: N/A;   No family history on file. History  Substance Use Topics  . Smoking status: Never Smoker   . Smokeless tobacco: Not on file  . Alcohol Use: No   OB History    Gravida Para Term Preterm AB TAB SAB Ectopic Multiple Living   1 1 1       1      Review of Systems  Constitutional: Negative for fever.  Gastrointestinal: Negative for nausea and vomiting.  Musculoskeletal: Positive for joint swelling and arthralgias. Negative for back pain and neck pain.  Skin: Positive for wound.  Neurological: Negative for weakness, numbness and headaches.   Allergies  Review of  patient's allergies indicates no known allergies.  Home Medications   Prior to Admission medications   Medication Sig Start Date End Date Taking? Authorizing Provider  fluticasone (FLONASE) 50 MCG/ACT nasal spray Place into both nostrils daily.   Yes Historical Provider, MD  prenatal vitamin w/FE, FA (PRENATAL 1 + 1) 27-1 MG TABS Take 1 tablet by mouth daily.      Historical Provider, MD   Triage Vitals: BP 135/63 mmHg  Pulse 80  Temp(Src) 99.4 F (37.4 C) (Oral)  Resp 16  Ht 5\' 7"  (1.702 m)  Wt 145 lb (65.772 kg)  BMI 22.71 kg/m2  SpO2 100%  LMP 12/08/2013 (Approximate)  Physical Exam  Constitutional: She is oriented to person, place, and time. She appears well-developed and well-nourished.  HENT:  Head: Normocephalic and atraumatic.  Neck: Normal range of motion. Neck supple.  Cardiovascular: Normal rate.   Pulmonary/Chest: Effort normal.  Musculoskeletal: She exhibits edema and tenderness.  Mild swelling over the proximal phalanx and PIP joint of the right pinky finger.  Small skin tear over DIP crease on palmar side of finger.  No active bleeding.  Pt unable to extend finger at baseline.  Normal sensation.  CRT<2  Neurological: She is alert and oriented to person, place, and time.  Skin: Skin is warm and dry.  Psychiatric: She has a normal  mood and affect.  Nursing note and vitals reviewed.  ED Course  Procedures (including critical care time)  DIAGNOSTIC STUDIES: Oxygen Saturation is 100% on RA, normal by my interpretation.    COORDINATION OF CARE: 6:35 PM- Discussed plans to obtain diagnostic imaging of right hand. Advised patient to take ibuprofen for pain management. Advised patient to be seen by hand surgeon. Pt advised of plan for treatment and pt agrees.  Labs Review Labs Reviewed - No data to display  Imaging Review Dg Hand Complete Right  01/05/2014   CLINICAL DATA:  Right little finger injury and pain. History of little finger fixed in the the flexed  position for 6 years following surgery.  EXAM: RIGHT HAND - COMPLETE 3+ VIEW  COMPARISON:  None.  FINDINGS: Flexion at the little finger PIP and DIP joints noted.  There is no evidence of acute fracture, or dislocation.  No other abnormalities are noted.  IMPRESSION: No evidence of acute abnormality.  Flexion at the little finger PIP and DIP joints.   Electronically Signed   By: Laveda AbbeJeff  Hu M.D.   On: 01/05/2014 18:05    EKG Interpretation None     MDM   Final diagnoses:  Finger laceration, initial encounter   No acute bony injury noted.  Wound does not appear to need sutures.  Was cleaned and dressing applied.  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  I personally performed the services described in this documentation, which was scribed in my presence. The recorded information has been reviewed and is accurate.  Rolan BuccoMelanie Sarye Kath, MD 01/05/14 [phone]

## 2014-01-05 NOTE — Discharge Instructions (Signed)

## 2014-01-05 NOTE — ED Notes (Signed)
July 2015. 

## 2015-08-01 IMAGING — CR DG HAND COMPLETE 3+V*R*
3 series · 3 of 3 positions shown · non-contrast
Comparison: None.

CLINICAL DATA: Right little finger injury and pain. History of
little finger fixed in the the flexed position for 6 years following
surgery.

EXAM:
RIGHT HAND - COMPLETE 3+ VIEW

[x hand pa right]
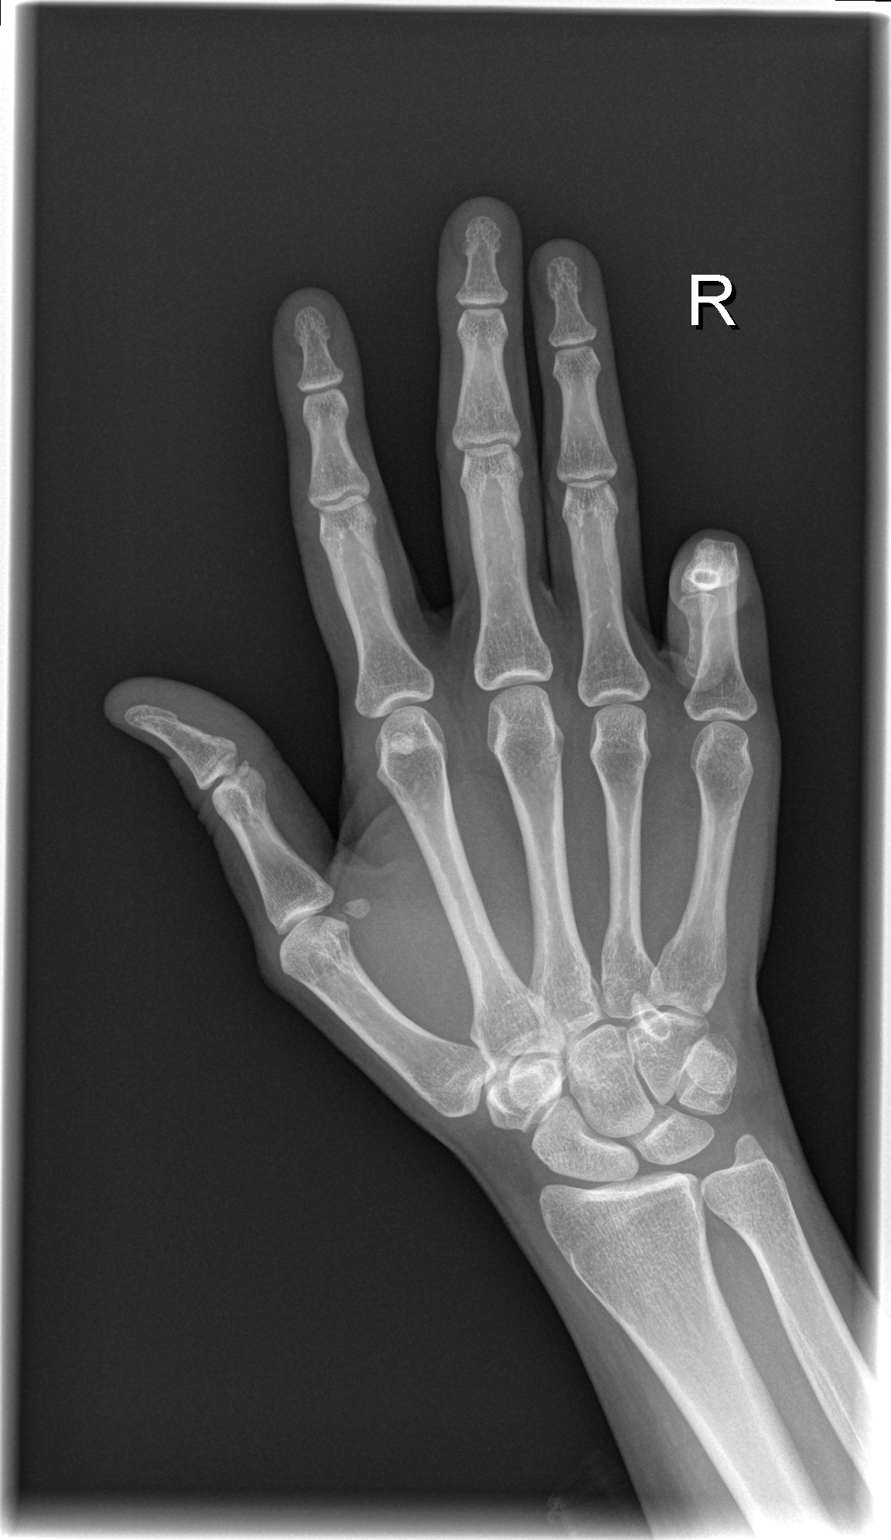

[x hand oblique right]
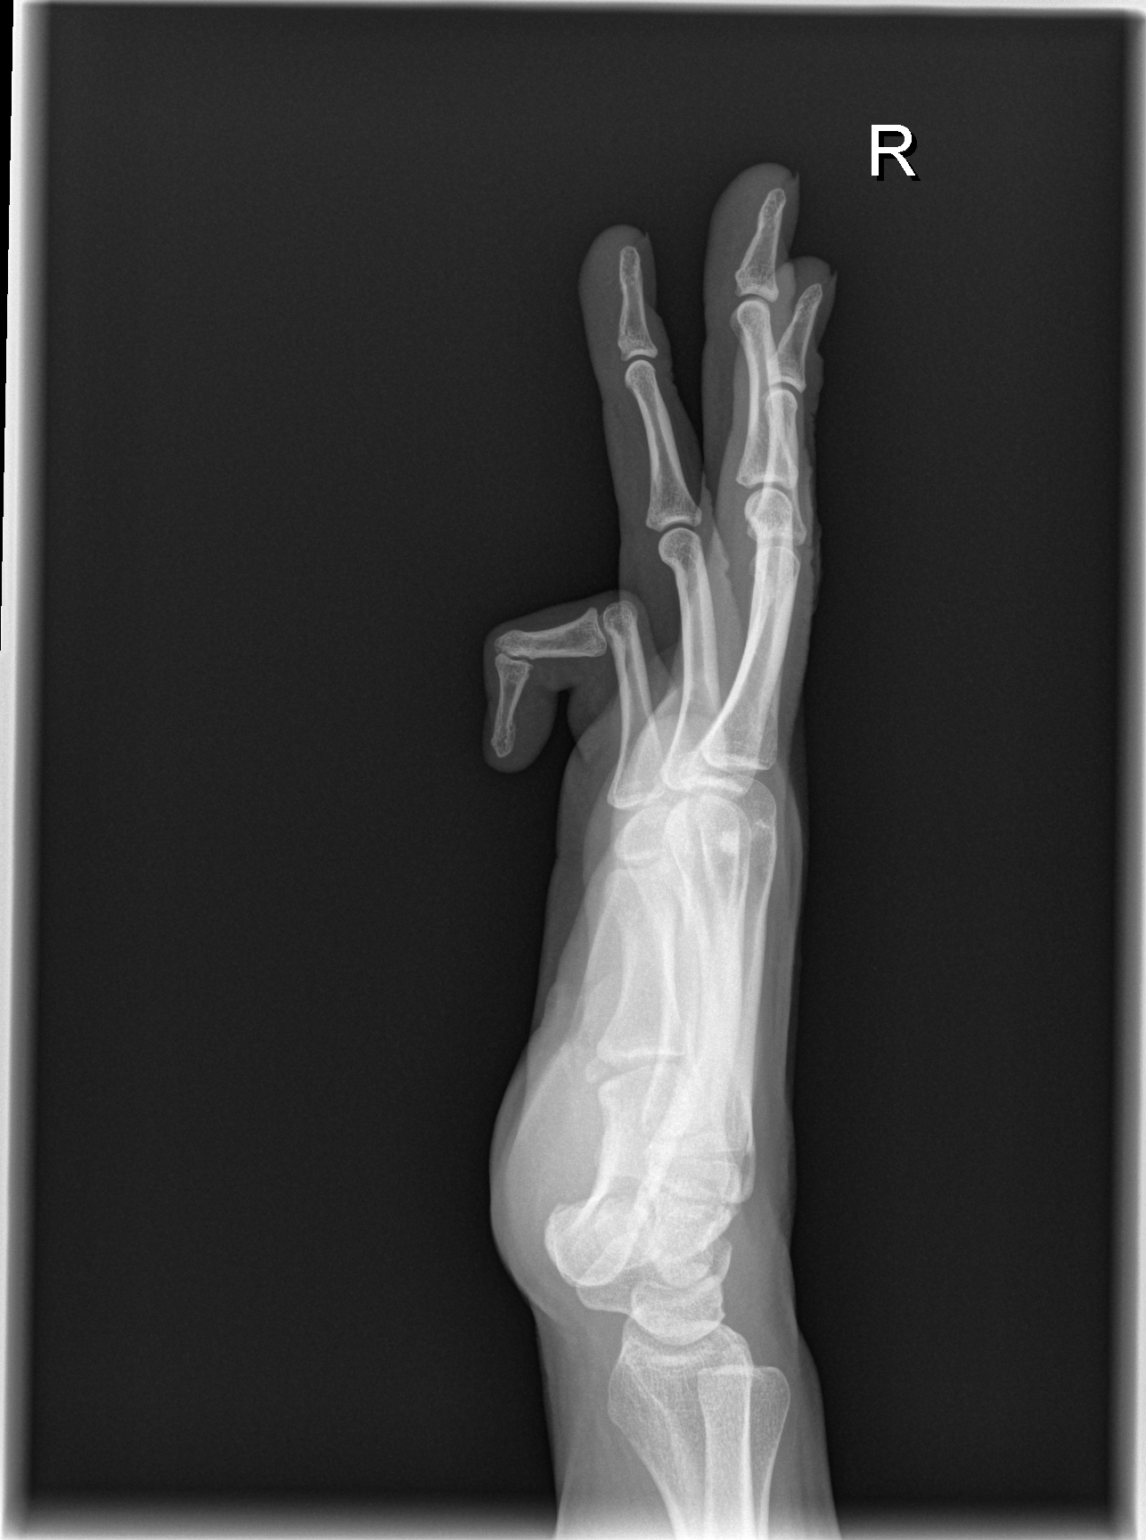

[x hand lat right]
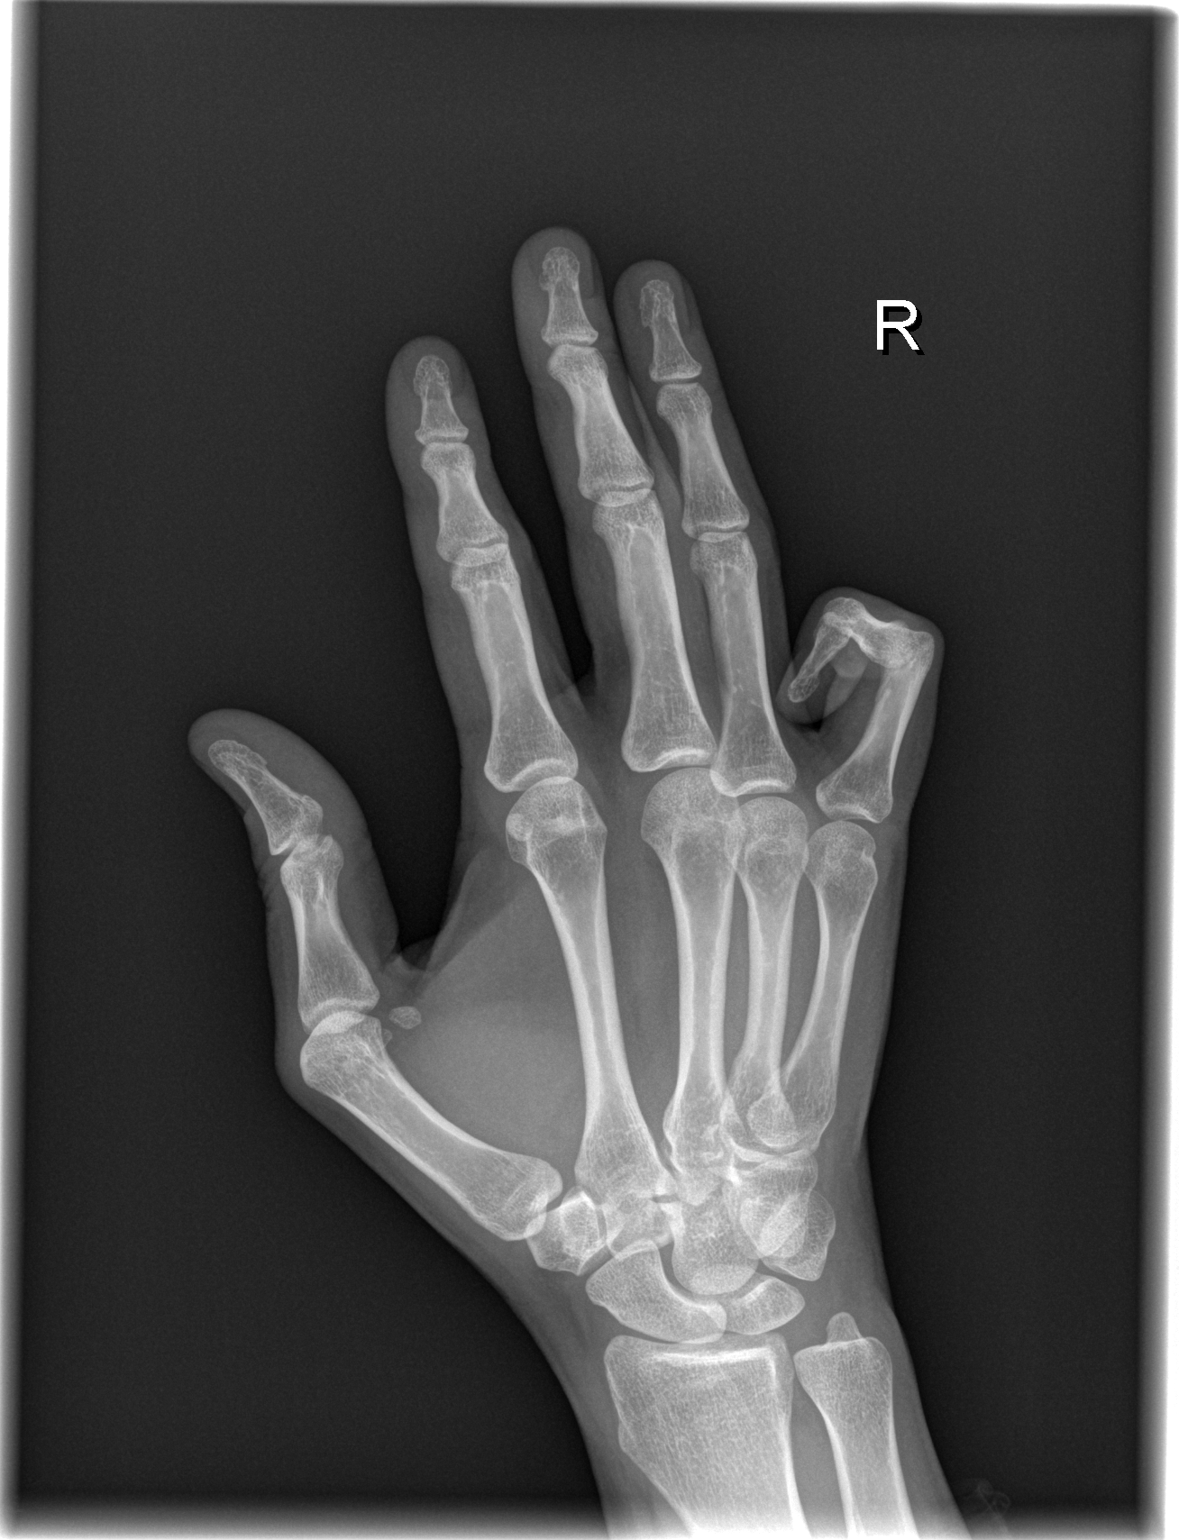

[3 of 3 positions shown; findings below may reference images not displayed]

FINDINGS: Flexion at the little finger PIP and DIP joints noted.

There is no evidence of acute fracture, or dislocation.

No other abnormalities are noted.
IMPRESSION: No evidence of acute abnormality.

Flexion at the little finger PIP and DIP joints.

## 2024-02-26 NOTE — Progress Notes (Signed)
 SURGERY ONCOLOGY CLINICAL VISIT   April Henderson  History of Present Illness The patient presents for a follow-up visit. Consulted 07/2022 for abdominal wall mass which was 1.6 x 1.4 x 1.4 cm.  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 Last seen 09/11/2023 noted top be NED. If stable transition to yearly.  She reports no current health issues or concerns. Patient denied N/V/D/C, fever, chills, night sweats, new or worsening headaches/abdominal pain, changes in surgical site- drainage/redness/swelling, unintentional changes in weight, changes is bowel habits- color/caliber/consistency/frequency, yellowing of the eyes/skin, cough, SOB or chest pain.   SOCIAL HISTORY Coffee/Tea/Caffeine-containing Drinks: The patient consumes [employer] coffee.  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    Otosclerosis of right ear 11/24/2020   Soft tissue mass 08/04/2022   Resolved Ambulatory Problems    Diagnosis Date Noted   No Resolved Ambulatory Problems   Past Medical History:  Diagnosis Date   Abnormal Pap smear of cervix    Dysfunction of right eustachian tube    Recurrent oral herpes simplex       Review of Systems - Negative except if noted in HPI  ECOG/Zubrod Performance Status: 0 - Fully active; no performance restrictions KPS (100-90%)   Objective Blood pressure 107/61, pulse 75, temperature 97.6 F (36.4 C), temperature source Temporal, height 1.702 m (5' 7), weight 77 kg (169 lb 12.8 oz), last menstrual period 02/09/2024, SpO2 99%. Wt Readings from Last 3 Encounters:  02/26/24 77 kg (169 lb 12.8 oz)  09/11/23 72.6  kg (160 lb)  07/10/23 71.5 kg (157 lb 9.6 oz)    HZW:Tzoo groomed, dressed appropriately, polite and interactive 49 y.o. female with no signs of obvious distress. Weight reviewed.  Physical Exam -vitals reviewed -Pulses RRR, no jugular distention, clear to auscultation, no murmur -unlabored on room air without respiratory distress, equal bilateral expansion without wheezing or cough. No SOB with regular conversation. Clear to auscultation. -Abdomen soft non tender, no herniation/well healed incisions -No BLE, ROM x 4 -interactive, appropriate thought process and mood    Results Imaging- CT CAP 02/26/2024  IMPRESSION: Ventral subcutaneous nodule resection with residual scarring. No evidence of recurrent or metastatic disease. Reviewed with patient at visit.  Tumor marker pattern over time has been the following:  No results found for: CA199, CEA, CHROMGRNA, AFP, AFPTUMOR, AFPTM, CA125, CA27, CA153, PSA, PSAFREE, HCGQUANT    Assessment & Plan 1. Follow-up visit. The patient's condition is stable with no reported issues or concerns.  The recent CT scan results were satisfactory. Given the positive outcomes of two consecutive CT scans, the frequency of these scans will be reduced to an annual basis.   The risks, benefits, and alternatives of reducing the frequency of CT scans were discussed. The patient was informed that yearly CT scans will continue to monitor her condition effectively while minimizing exposure to radiation and reducing the inconvenience of frequent hospital visits. She was advised to check in at the first desk for her CT scan before meeting with the clinician to avoid any confusion or delays.  Follow-up Follow-up appointment scheduled for 02/2025.      Orders Placed This Encounter  Procedures   CT Chest Abdomen Pelvis  W Contrast   CBC without Differential   Comprehensive Metabolic Panel    Return in about 1 year (around 02/25/2025)  for following scans, and labs, To see Corean Albee, NP.   This note was completed using DAX. Patient provided verbal consent for use of DAX. I performed the exam, reviewed the diagnostics as indicated above. Electronically signed by:  Corean Arlean Albee, NP, 02/26/2024 12:29 PM        [1]  Current Outpatient Medications:    fluticasone propionate (FLONASE) 50 mcg/spray nasal spray, SHAKE LIQUID AND USE 2 SPRAYS IN EACH NOSTRIL DAILY, Disp: 16 g, Rfl: 11   ibuprofen  (MOTRIN ) 200 mg tablet, Take 400 mg by mouth as needed (pain)., Disp: , Rfl:    lisdexamfetamine (VYVANSE) 40 mg capsule, Take 40 mg by mouth daily., Disp: , Rfl:    valACYclovir (VALTREX) 1 gram tablet, Take 1,000 mg by mouth as needed (outbreaks)., Disp: , Rfl:  No current facility-administered medications for this visit. [2] Allergies Allergen Reactions   Penicillin  Other (See Comments)    Causes SEVERE Vulvovaginal Candidiasis (Amoxicillin is okay)

## 2024-03-26 ENCOUNTER — Other Ambulatory Visit: Payer: Self-pay

## 2024-03-26 ENCOUNTER — Emergency Department (HOSPITAL_BASED_OUTPATIENT_CLINIC_OR_DEPARTMENT_OTHER): Admission: EM | Admit: 2024-03-26 | Discharge: 2024-03-26 | Disposition: A | Discharge status: Home / Self Care

## 2024-03-26 ENCOUNTER — Encounter (HOSPITAL_BASED_OUTPATIENT_CLINIC_OR_DEPARTMENT_OTHER): Payer: Self-pay | Admitting: Emergency Medicine

## 2024-03-26 ENCOUNTER — Emergency Department (HOSPITAL_BASED_OUTPATIENT_CLINIC_OR_DEPARTMENT_OTHER)

## 2024-03-26 DIAGNOSIS — M545 Low back pain, unspecified: Secondary | ICD-10-CM | POA: Insufficient documentation

## 2024-03-26 LAB — CBC WITH DIFFERENTIAL/PLATELET
Abs Immature Granulocytes: 0.03 10*3/uL (ref 0.00–0.07)
Basophils Absolute: 0 10*3/uL (ref 0.0–0.1)
Basophils Relative: 0 %
Eosinophils Absolute: 0.3 10*3/uL (ref 0.0–0.5)
Eosinophils Relative: 3 %
HCT: 38.8 % (ref 36.0–46.0)
Hemoglobin: 13 g/dL (ref 12.0–15.0)
Immature Granulocytes: 0 %
Lymphocytes Relative: 21 %
Lymphs Abs: 1.9 10*3/uL (ref 0.7–4.0)
MCH: 29.7 pg (ref 26.0–34.0)
MCHC: 33.5 g/dL (ref 30.0–36.0)
MCV: 88.6 fL (ref 80.0–100.0)
Monocytes Absolute: 0.6 10*3/uL (ref 0.1–1.0)
Monocytes Relative: 7 %
Neutro Abs: 6.3 10*3/uL (ref 1.7–7.7)
Neutrophils Relative %: 69 %
Platelets: 257 10*3/uL (ref 150–400)
RBC: 4.38 MIL/uL (ref 3.87–5.11)
RDW: 13.4 % (ref 11.5–15.5)
WBC: 9.2 10*3/uL (ref 4.0–10.5)
nRBC: 0 % (ref 0.0–0.2)

## 2024-03-26 LAB — COMPREHENSIVE METABOLIC PANEL WITH GFR
ALT: 33 U/L (ref 0–44)
AST: 25 U/L (ref 15–41)
Albumin: 4.5 g/dL (ref 3.5–5.0)
Alkaline Phosphatase: 69 U/L (ref 38–126)
Anion gap: 11 (ref 5–15)
BUN: 19 mg/dL (ref 6–20)
CO2: 24 mmol/L (ref 22–32)
Calcium: 9 mg/dL (ref 8.9–10.3)
Chloride: 104 mmol/L (ref 98–111)
Creatinine, Ser: 0.73 mg/dL (ref 0.44–1.00)
GFR, Estimated: >60 mL/min
Glucose, Bld: 88 mg/dL (ref 70–99)
Potassium: 4.3 mmol/L (ref 3.5–5.1)
Sodium: 139 mmol/L (ref 135–145)
Total Bilirubin: 0.2 mg/dL (ref 0.0–1.2)
Total Protein: 7.2 g/dL (ref 6.5–8.1)

## 2024-03-26 LAB — URINALYSIS, ROUTINE W REFLEX MICROSCOPIC
Bilirubin Urine: NEGATIVE
Glucose, UA: NEGATIVE mg/dL
Hgb urine dipstick: NEGATIVE
Ketones, ur: 15 mg/dL — AB
Nitrite: NEGATIVE
Protein, ur: NEGATIVE mg/dL
Specific Gravity, Urine: 1.02 (ref 1.005–1.030)
pH: 8 (ref 5.0–8.0)

## 2024-03-26 LAB — HCG, SERUM, QUALITATIVE: Preg, Serum: NEGATIVE

## 2024-03-26 LAB — URINALYSIS, MICROSCOPIC (REFLEX)

## 2024-03-26 MED ORDER — CYCLOBENZAPRINE HCL 10 MG PO TABS: 10.0000 mg | ORAL_TABLET | Freq: Two times a day (BID) | ORAL | 0 refills | Status: AC | PRN

## 2024-03-26 MED ORDER — LIDOCAINE 5 % EX PTCH
1.0000 | MEDICATED_PATCH | CUTANEOUS | Status: DC
  Administered 2024-03-26: 1 via TRANSDERMAL
  Filled 2024-03-26: qty 1

## 2024-03-26 MED ORDER — LIDOCAINE 5 % EX PTCH: 1.0000 | MEDICATED_PATCH | CUTANEOUS | 0 refills | Status: AC

## 2024-03-26 MED ORDER — CYCLOBENZAPRINE HCL 5 MG PO TABS
5.0000 mg | ORAL_TABLET | Freq: Once | ORAL | Status: AC
  Administered 2024-03-26: 5 mg via ORAL
  Filled 2024-03-26: qty 1

## 2024-03-26 MED ORDER — KETOROLAC TROMETHAMINE 15 MG/ML IJ SOLN
15.0000 mg | Freq: Once | INTRAMUSCULAR | Status: AC
  Administered 2024-03-26: 15 mg via INTRAVENOUS
  Filled 2024-03-26: qty 1

## 2024-03-26 NOTE — Discharge Instructions (Addendum)
 For pain, you can take 1000 mg of Tylenol  or 1 g of Tylenol  every 6-8 hours.  Do not exceed more than 4000 mg or 4 g in a 24-hour period.  You can also take ibuprofen  600 to 800 mg every 6-8 hours as well.  Do not take this high-dose ibuprofen  for greater than a week.   Make sure the Flexeril  does not make you dizzy before taking it in the morning and subsequent driving.  Take it first at night to see how you feel.  Do not need to take this all time but does need to take it whenever you are feeling having muscle spasms.  I really do think you should follow-up with physical therapy.   If you ever have any kind of bowel or bladder incontinence, numbness of groin area, difficulty walking because of back pain or unexplained fevers and please come back to the ED for further evaluation.

## 2024-03-26 NOTE — ED Notes (Signed)
 Patient transported to CT

## 2024-03-26 NOTE — ED Triage Notes (Signed)
 Pt tearful in triage- pt c/o mid back pain x 1 week, worse today after coughing. Denies urinary sx.   Reports hx of lower back pain.
